# Patient Record
Sex: Female | Born: 1960 | Race: White | Hispanic: No | Marital: Married
Health system: Southern US, Community
[De-identification: ages and names within clinical notes are randomized; demographics above are authoritative.]

---

## 2016-11-05 ENCOUNTER — Emergency Department (HOSPITAL_COMMUNITY): Payer: BLUE CROSS/BLUE SHIELD

## 2016-11-05 ENCOUNTER — Emergency Department (HOSPITAL_COMMUNITY)
Admission: EM | Admit: 2016-11-05 | Discharge: 2016-11-06 | Disposition: A | Discharge status: Home / Self Care | Payer: BLUE CROSS/BLUE SHIELD | Attending: Emergency Medicine | Admitting: Emergency Medicine

## 2016-11-05 ENCOUNTER — Encounter (HOSPITAL_COMMUNITY): Payer: Self-pay

## 2016-11-05 DIAGNOSIS — Y999 Unspecified external cause status: Secondary | ICD-10-CM | POA: Diagnosis not present

## 2016-11-05 DIAGNOSIS — W010XXA Fall on same level from slipping, tripping and stumbling without subsequent striking against object, initial encounter: Secondary | ICD-10-CM | POA: Insufficient documentation

## 2016-11-05 DIAGNOSIS — Y9389 Activity, other specified: Secondary | ICD-10-CM | POA: Insufficient documentation

## 2016-11-05 DIAGNOSIS — S52591A Other fractures of lower end of right radius, initial encounter for closed fracture: Secondary | ICD-10-CM | POA: Insufficient documentation

## 2016-11-05 DIAGNOSIS — Y929 Unspecified place or not applicable: Secondary | ICD-10-CM | POA: Diagnosis not present

## 2016-11-05 DIAGNOSIS — S62101A Fracture of unspecified carpal bone, right wrist, initial encounter for closed fracture: Secondary | ICD-10-CM

## 2016-11-05 DIAGNOSIS — S6991XA Unspecified injury of right wrist, hand and finger(s), initial encounter: Secondary | ICD-10-CM | POA: Diagnosis present

## 2016-11-05 MED ORDER — LIDOCAINE HCL (PF) 1 % IJ SOLN
5.0000 mL | Freq: Once | INTRAMUSCULAR | Status: DC
  Filled 2016-11-05: qty 5

## 2016-11-05 MED ORDER — LIDOCAINE HCL (PF) 1 % IJ SOLN
30.0000 mL | Freq: Once | INTRAMUSCULAR | Status: AC
  Administered 2016-11-05: 30 mL via INTRADERMAL
  Filled 2016-11-05: qty 30

## 2016-11-05 MED ORDER — OXYCODONE-ACETAMINOPHEN 5-325 MG PO TABS: 1.0000 | ORAL_TABLET | Freq: Once | ORAL | Status: DC

## 2016-11-05 NOTE — ED Triage Notes (Signed)
Pt states she was playing tennis and tripped; pt fell catching self to with hands; pt c/o rt wrist pain; pt has deformity noted to rt wrist; pt c/o pain at 5/10 on arrival; Pt wrist has been splinted at triage and waiting for xray to be done; pt a&ox 4; pt denies any other sx from fall

## 2016-11-05 NOTE — Discharge Instructions (Signed)
Follow the instructions that Dr. Amanda Pea discussed with you. Do not drive while taking the narcotic as it will make you sleepy. Follow up with your orthopedic surgeon at home. Return here as needed.

## 2016-11-05 NOTE — ED Provider Notes (Signed)
[facility]-EMERGENCY DEPT Provider Note   CSN: [id number] Arrival date & time: 11/05/16  1936   By signing my name below, I, Stephanie Davenport, attest that this documentation has been prepared under the direction and in the presence of Kerrie Buffalo, NP. Electronically Signed: Teofilo Davenport, ED Scribe. 11/05/2016. 9:15 PM.   History   Chief Complaint Chief Complaint  Patient presents with  . Wrist Pain    The history is provided by the patient. No language interpreter was used.   HPI Comments:  Stephanie Davenport is a 56 y.o. female who presents to the Emergency Department s/p right wrist injury that occurred PTA. Pt reports that she was playing tennis on a clay court and tripped, bracing her fall with her right wrist. She complains of constant pain since the injury, and rates her pain at 5/10. She complains of associated numbness in her right fingers. Pt had her wrist splinted in triage. No alleviating factors noted. Pt denies other associated symptoms. Patient denies head injury or LOC.   History reviewed. No pertinent past medical history.  There are no active problems to display for this patient.   History reviewed. No pertinent surgical history.  OB History    No data available       Home Medications    Prior to Admission medications   Not on File    Family History No family history on file.  Social History Social History  Substance Use Topics  . Smoking status: Not on file  . Smokeless tobacco: Not on file  . Alcohol use Not on file     Allergies   Patient has no allergy information on record.   Review of Systems Review of Systems  Constitutional: Negative for activity change.  Gastrointestinal: Negative for vomiting. Nausea: initially but resolved.  Musculoskeletal: Positive for arthralgias.  Skin: Negative for wound.  Neurological: Positive for numbness. Negative for syncope.  Psychiatric/Behavioral: Negative for confusion.     Physical Exam Updated  Vital Signs BP 109/63 (BP Location: Right Arm)   Pulse 80   Temp 97.9 F (36.6 C) (Oral)   Resp 16   Ht  (1.549 m)   Wt 53.5 kg   SpO2 100%   BMI 22.30 kg/m   Physical Exam  Constitutional: She appears well-developed and well-nourished. No distress.  HENT:  Head: Normocephalic and atraumatic.  Eyes: Conjunctivae and EOM are normal.  Neck: Neck supple.  Cardiovascular: Normal rate.   Radial pulse 2+, adequate circulation   Pulmonary/Chest: Effort normal.  Musculoskeletal:       Right wrist: She exhibits decreased range of motion, tenderness, bony tenderness, swelling and deformity. She exhibits no laceration.  Limited ROM of right wrist due to pain and swelling. Radial pulses 2+, adequate circulation.   Neurological: She is alert. No sensory deficit. Gait normal.  Skin: Skin is warm and dry.  Psychiatric: She has a normal mood and affect. Her behavior is normal.  Nursing note and vitals reviewed.    ED Treatments / Results  DIAGNOSTIC STUDIES:  Oxygen Saturation is 99% on RA, normal by my interpretation.    COORDINATION OF CARE:  9:13 PM Will order x-ray of right wrist. Discussed treatment plan with pt at bedside and pt agreed to plan.  Consult with Dr. Amanda Pea and he will see the patient in the ED.   11:37 PM Dr. Amanda Pea reduced the fracture. Pt given a splint, ice, elevation, and pain medication. Pt will follow up with her orthopedist at  home in [place]. See procedure note from Dr. Amanda Pea.    Labs (all labs ordered are listed, but only abnormal results are displayed) Labs Reviewed - No data to display  Radiology Dg Wrist Complete Right  Result Date: 11/05/2016 CLINICAL DATA:  High-resolution and cough. EXAM: RIGHT WRIST - COMPLETE 3+ VIEW COMPARISON:  None. FINDINGS: Acute, closed, distal metaphyseal fracture of the radius with dorsal angulation and slight displacement. Intra-articular extension into the radiocarpal joint at the level of the scapholunate  ligament. Transverse nondisplaced fracture of the ulnar styloid is also seen. Periarticular soft tissue swelling is noted due to the fractures. Carpal bones appear intact. IMPRESSION: Acute right distal diaphyseal fracture of the radius with dorsal angulation and slight displacement. Intra-articular extension of the fracture to the radiocarpal joint is seen. Transverse nondisplaced appearing fracture the ulnar styloid. Findings are in keeping with a Colles fracture. Electronically Signed   By: Tollie Eth M.D.   On: 11/05/2016 20:43    Procedures Procedures (including critical care time)  Medications Ordered in ED Medications  lidocaine (PF) (XYLOCAINE) 1 % injection 30 mL (30 mLs Intradermal Given 11/05/16 2334)     Initial Impression / Assessment and Plan / ED Course  I have reviewed the triage vital signs and the nursing notes.  Final Clinical Impressions(s) / ED Diagnoses  Patient stable for d/c without focal neuro deficits.  Final diagnoses:  Wrist fracture, right, closed, initial encounter    New Prescriptions There are no discharge medications for this patient. I personally performed the services described in this documentation, which was scribed in my presence. The recorded information has been reviewed and is accurate.     [place], NP 11/07/16 1547    Nira Conn, MD 11/08/16 [phone]

## 2016-11-05 NOTE — Consult Note (Signed)
Reason for Consult:right DRF displaced Referring Physician: ER Staff  Stephanie Davenport is an 56 y.o. female.  HPI: 56 year old female who presents with a right displaced distal radius fracture. She complains of wrist pain only. She denies numbness tingling locking popping catching. The initial ER notes mentioned numbness however she states she is fully sensate and notes no locking popping catching or other problems.  She denies neck back chest or nominal pain.  She was playing tennis fell and sustained the isolated injury. She lives in [place].  History reviewed. No pertinent past medical history.  History reviewed. No pertinent surgical history.  No family history on file.  Social History:  has no tobacco, alcohol, and drug history on file.  Allergies: Not on File  Medications: I have reviewed the patient's current medications.  No results found for this or any previous visit (from the past 48 hour(s)).  Dg Wrist Complete Right  Result Date: 11/05/2016 CLINICAL DATA:  High-resolution and cough. EXAM: RIGHT WRIST - COMPLETE 3+ VIEW COMPARISON:  None. FINDINGS: Acute, closed, distal metaphyseal fracture of the radius with dorsal angulation and slight displacement. Intra-articular extension into the radiocarpal joint at the level of the scapholunate ligament. Transverse nondisplaced fracture of the ulnar styloid is also seen. Periarticular soft tissue swelling is noted due to the fractures. Carpal bones appear intact. IMPRESSION: Acute right distal diaphyseal fracture of the radius with dorsal angulation and slight displacement. Intra-articular extension of the fracture to the radiocarpal joint is seen. Transverse nondisplaced appearing fracture the ulnar styloid. Findings are in keeping with a Colles fracture. Electronically Signed   By: Tollie Eth M.D.   On: 11/05/2016 20:43    Review of Systems  Eyes: Negative.   Respiratory: Negative.   Cardiovascular: Negative.   Gastrointestinal:  Negative.   Genitourinary: Negative.   Neurological: Negative.   Psychiatric/Behavioral: Negative.    Blood pressure (!) 102/57, pulse 76, temperature 97.9 F (36.6 C), temperature source Oral, resp. rate 20, height  (1.549 m), weight 53.5 kg (118 lb), SpO2 99 %. Physical Exam female alert and oriented in no acute distress she has obvious deformity of her right wrist is no evidence of infection or dystrophy no evidence of compartment syndrome. She is sensate throughout has no evidence of acute carpal tunnel syndrome. Her opposite extremity is neurovascularly intact. Once again she is sensate she has good refill she has a very deformed wrist.  The patient is alert and oriented in no acute distress. The patient complains of pain in the affected upper extremity.  The patient is noted to have a normal HEENT exam. Lung fields show equal chest expansion and no shortness of breath. Abdomen exam is nontender without distention. Lower extremity examination does not show any fracture dislocation or blood clot symptoms. Pelvis is stable and the neck and back are stable and nontender. Assessment/Plan: Comminuted distal radius fracture with associated ulna fracture right upper extremity.  I've consented for close reduction.  Procedure she was given a hematoma block with lidocaine 10 mL this was done sterilely following this she was placed in fingertrap traction and underwent manipulative reduction. After reduction the patient had AP lateral and oblique x-rays which looked excellent. I was pleased this and the findings. Fluoroscopy x-rays were taken and given to the patient.  I discussed her all issues. This was a closed reduction under hematoma block with 4 view radiographic series performed examined and interpreted by myself  Given her age comminution and other issues and recommend definitive surgical  fixation.  I recommended vitamin C, calcium with vitamin D, Peri-Colace. I've given her  prescription for OxyIR and Zofran.  She plans to follow-up in [place] and I have once skin recommended the absolute necessity in my opinion of giving her a good wrist with definitive fixation.     Keep bandage clean and dry.  Call for any problems.  No smoking.  Criteria for driving a car: you should be off your pain medicine for 7-8 hours, able to drive one handed(confident), thinking clearly and feeling able in your judgement to drive. Continue elevation as it will decrease swelling.  If instructed by MD move your fingers within the confines of the bandage/splint.  Use ice if instructed by your MD. Call immediately for any sudden loss of feeling in your hand/arm or change in functional abilities of the extremity.  We recommend that you to take vitamin C 1000 mg a day to promote healing. We also recommend that if you require  pain medicine that you take a stool softener to prevent constipation as most pain medicines will have constipation side effects. We recommend either Peri-Colace or Senokot and recommend that you also consider adding MiraLAX as well to prevent the constipation affects from pain medicine if you are required to use them. These medicines are over the counter and may be purchased at a local pharmacy. A cup of yogurt and a probiotic can also be helpful during the recovery process as the medicines can disrupt your intestinal environment.  Karen Chafe 11/05/2016, 11:37 PM

## 2016-11-06 NOTE — ED Notes (Signed)
Pt stable, understands discharge instructions, and reasons for return.   

## 2018-04-29 IMAGING — DX DG WRIST COMPLETE 3+V*R*
4 series · 4 of 4 positions shown · non-contrast
Comparison: None.

CLINICAL DATA: High-resolution and cough.

EXAM:
RIGHT WRIST - COMPLETE 3+ VIEW

[x wrist pa right]
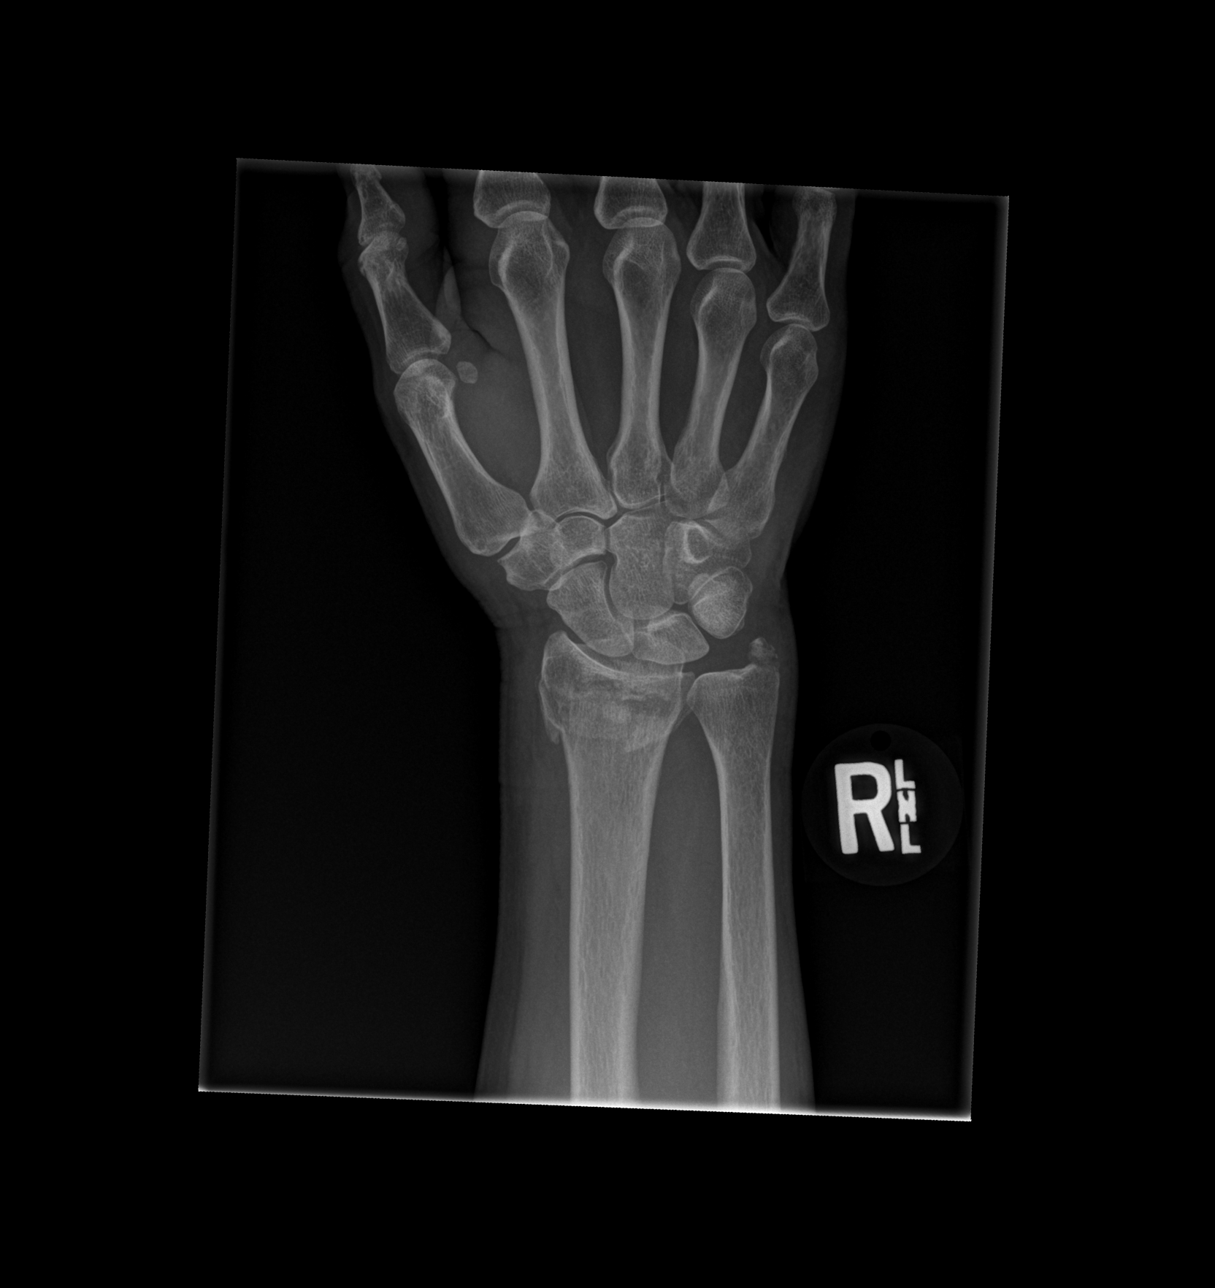

[x wrist navicular view right]
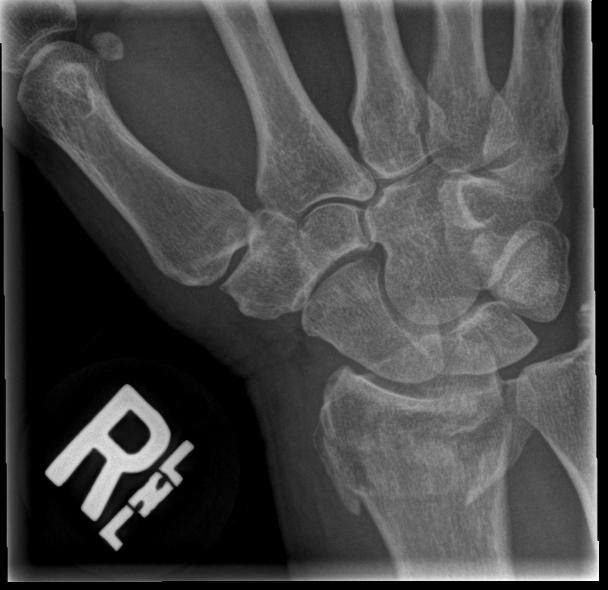

[x wrist obl right]
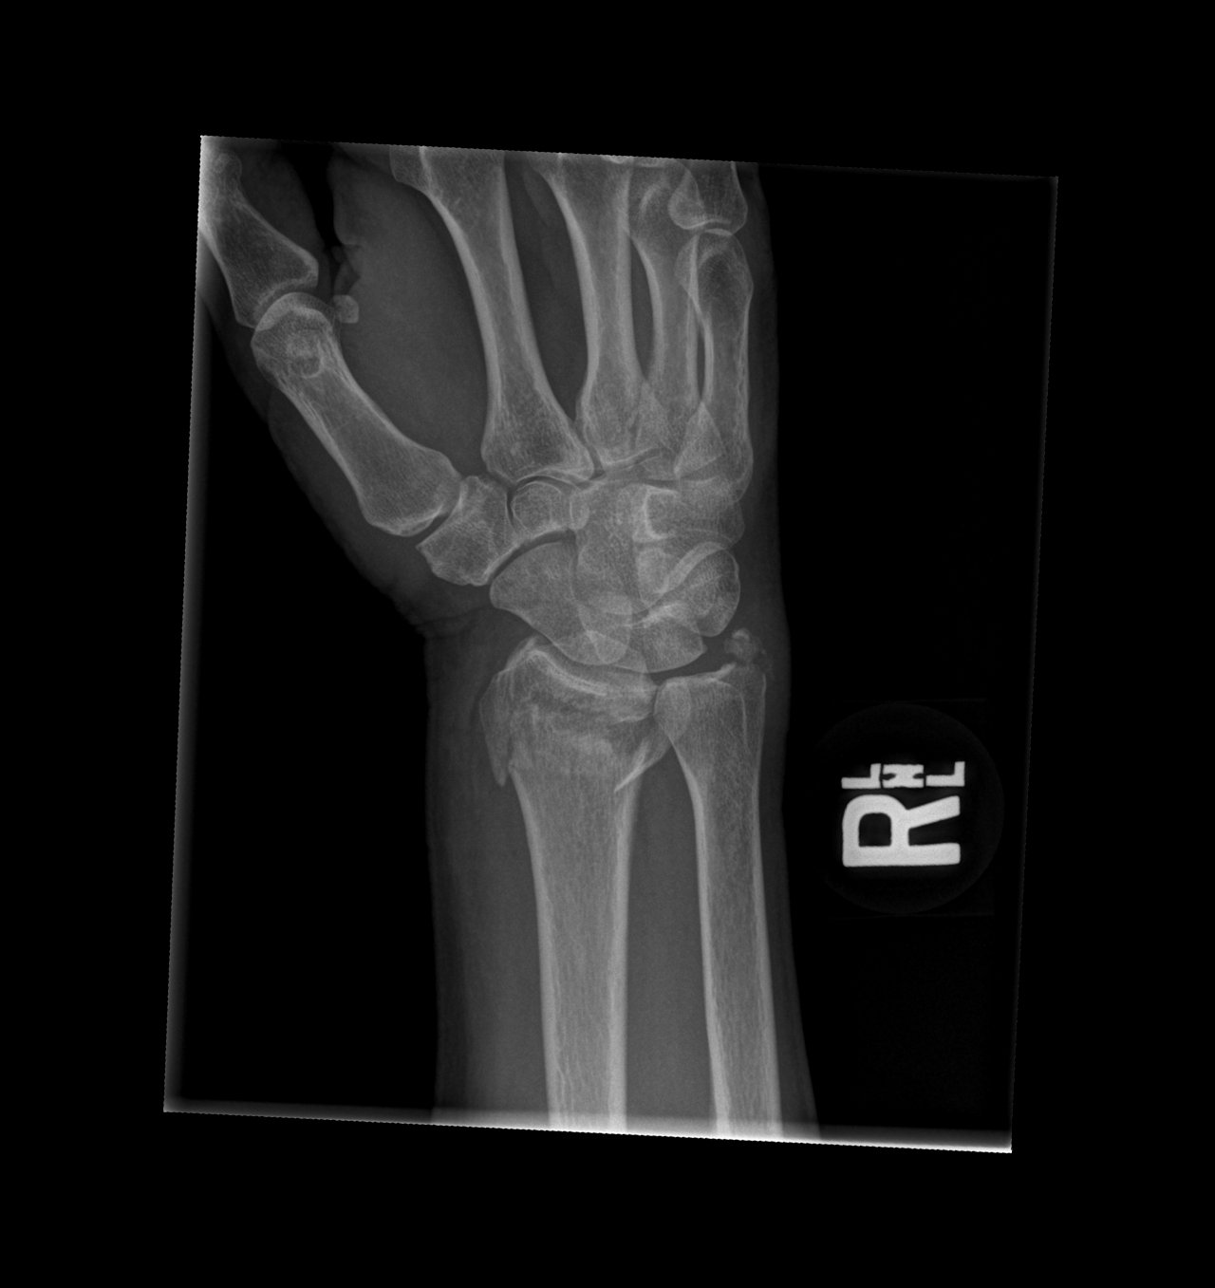

[x wrist lat right]
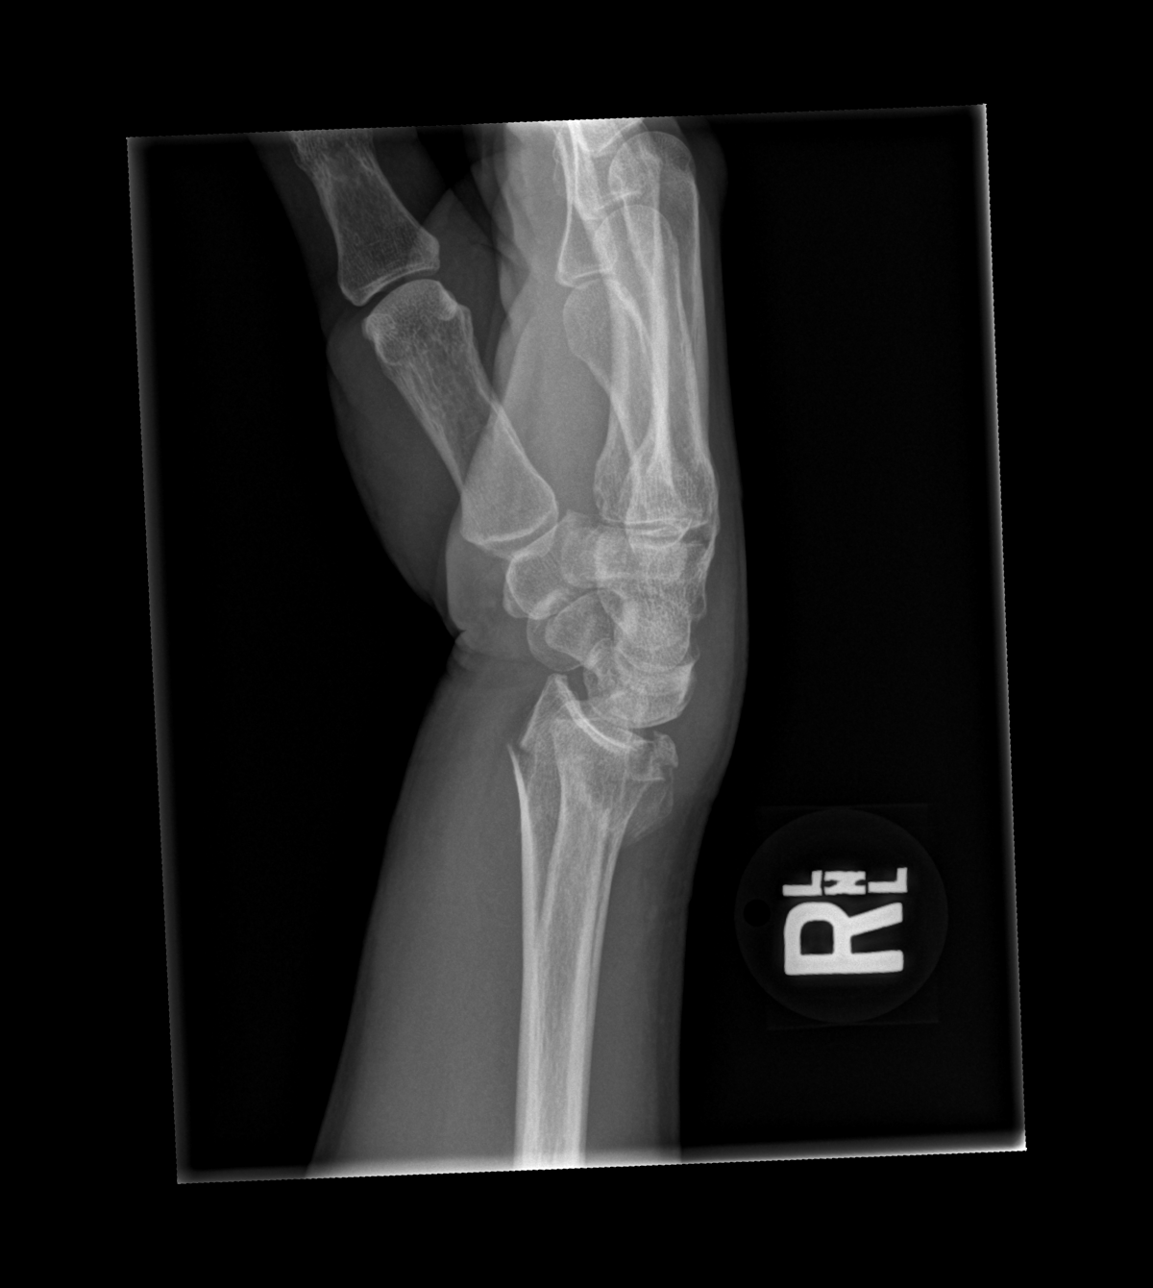

[4 of 4 positions shown; findings below may reference images not displayed]

FINDINGS: Acute, closed, distal metaphyseal fracture of the radius with dorsal
angulation and slight displacement. Intra-articular extension into
the radiocarpal joint at the level of the scapholunate ligament.
Transverse nondisplaced fracture of the ulnar styloid is also seen.
Periarticular soft tissue swelling is noted due to the fractures.
Carpal bones appear intact.
IMPRESSION: Acute right distal diaphyseal fracture of the radius with dorsal
angulation and slight displacement. Intra-articular extension of the
fracture to the radiocarpal joint is seen. Transverse nondisplaced
appearing fracture the ulnar styloid. Findings are in keeping with a
Colles fracture.
